# Patient Record
Sex: Female | Born: 1980
Health system: Southern US, Community
[De-identification: ages and names within clinical notes are randomized; demographics above are authoritative.]

---
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
## 1999-01-19 ENCOUNTER — Other Ambulatory Visit: Admission: RE | Admit: 1999-01-19 | Discharge: 1999-01-19 | Payer: Self-pay | Admitting: Obstetrics & Gynecology

## 1999-08-15 ENCOUNTER — Inpatient Hospital Stay (HOSPITAL_COMMUNITY): Admission: AD | Admit: 1999-08-15 | Discharge: 1999-08-18 | Payer: Self-pay | Admitting: Obstetrics and Gynecology

## 1999-09-25 ENCOUNTER — Other Ambulatory Visit: Admission: RE | Admit: 1999-09-25 | Discharge: 1999-09-25 | Payer: Self-pay | Admitting: Obstetrics & Gynecology

## 2000-09-24 ENCOUNTER — Other Ambulatory Visit: Admission: RE | Admit: 2000-09-24 | Discharge: 2000-09-24 | Payer: Self-pay | Admitting: Obstetrics and Gynecology

## 2000-11-20 ENCOUNTER — Inpatient Hospital Stay (HOSPITAL_COMMUNITY): Admission: AD | Admit: 2000-11-20 | Discharge: 2000-11-20 | Payer: Self-pay | Admitting: Obstetrics and Gynecology

## 2000-12-31 ENCOUNTER — Ambulatory Visit (HOSPITAL_COMMUNITY): Admission: RE | Admit: 2000-12-31 | Discharge: 2000-12-31 | Payer: Self-pay | Admitting: *Deleted

## 2001-04-29 ENCOUNTER — Inpatient Hospital Stay (HOSPITAL_COMMUNITY): Admission: AD | Admit: 2001-04-29 | Discharge: 2001-04-29 | Payer: Self-pay | Admitting: Obstetrics and Gynecology

## 2001-05-02 ENCOUNTER — Encounter (HOSPITAL_COMMUNITY): Admission: AD | Admit: 2001-05-02 | Discharge: 2001-05-08 | Payer: Self-pay | Admitting: Obstetrics & Gynecology

## 2001-05-07 ENCOUNTER — Encounter (INDEPENDENT_AMBULATORY_CARE_PROVIDER_SITE_OTHER): Payer: Self-pay | Admitting: Specialist

## 2001-05-07 ENCOUNTER — Inpatient Hospital Stay (HOSPITAL_COMMUNITY): Admission: AD | Admit: 2001-05-07 | Discharge: 2001-05-11 | Payer: Self-pay | Admitting: *Deleted

## 2004-05-22 ENCOUNTER — Emergency Department (HOSPITAL_COMMUNITY): Admission: EM | Admit: 2004-05-22 | Discharge: 2004-05-22 | Payer: Self-pay | Admitting: Emergency Medicine

## 2007-04-16 ENCOUNTER — Emergency Department (HOSPITAL_COMMUNITY): Admission: EM | Admit: 2007-04-16 | Discharge: 2007-04-16 | Payer: Self-pay | Admitting: Emergency Medicine

## 2007-05-28 ENCOUNTER — Encounter: Admission: RE | Admit: 2007-05-28 | Discharge: 2007-05-28 | Payer: Self-pay | Admitting: Internal Medicine

## 2007-07-09 ENCOUNTER — Encounter: Admission: RE | Admit: 2007-07-09 | Discharge: 2007-07-09 | Payer: Self-pay | Admitting: Internal Medicine

## 2009-01-14 ENCOUNTER — Encounter: Admission: RE | Admit: 2009-01-14 | Discharge: 2009-01-14 | Payer: Self-pay | Admitting: Internal Medicine

## 2010-08-18 NOTE — Op Note (Signed)
[facility]  Patient:    Mary Proctor, Mary Proctor Visit Number: [id number] MRN: [id number]          Service Type: OBS Location: 910A [id number] [id number] Attending Physician:  Michaelle Copas Dictated by:   Shearon Balo, M.D. Proc. Date: 05/10/01 Admit Date:  05/07/2001 Discharge Date: 05/11/2001   CC:         Conni Elliot, M.D.   Operative Report  PREOPERATIVE DIAGNOSIS:       A 30 year old, G2, P2, status post spontaneous vaginal delivery with undesired fertility.  POSTOPERATIVE DIAGNOSIS:      A 30 year old, G2, P2, status post spontaneous vaginal delivery with undesired fertility.  PROCEDURE:                    Postpartum bilateral tubal ligation.  SURGEON:                      Shearon Balo, M.D.  ASSISTANT:                    Conni Elliot, M.D.  ANESTHESIA:                   Epidural.  COMPLICATIONS:                None.  ESTIMATED BLOOD LOSS:         Minimum.  PATHOLOGY:                    Bilateral segments of fallopian tubes.  DISPOSITION:                  Recovery room, stable.  DESCRIPTION OF PROCEDURE:     The patient was counseled by Dr. Gavin Potters regarding tubal ligation. The patient was taken to the operating room where epidural anesthesia was bolused. She was prepped and draped in sterile fashion. An incision was made at the umbilicus with the scalpel and carried down to the underlying fascia with Mayo scissors. The fascia was entered sharply with Mayos as well as the peritoneum. The [employer] retractors were used to expose the patients right fallopian tube, which was identified and followed out to the fimbriated end. It was grasped with Babcock clamp in the mid section approximately 3 to 4 cm from the cornual region. Two sutures of 2-0 chromic were used to ligate the patients right fallopian tube and form a knuckle of tube. The section of tube was then excised and handed off for pathology. The tube was hemostatic and returned to  the abdominal cavity. The patients other fallopian tube was identified in a similar fashion. Again, doubly ligated and approximately 1-cm segment was excised. This was sent, as well, to pathology. This tube was noted to be hemostatic and returned to the abdominal cavity. The fascia was then closed with a running stitch of 0 Vicryl. The skin was then reapproximated with a subcuticular stitch of 4-0 PDS. The patient tolerated the procedure well and returned to recovery room in stable condition. Dictated by:   Shearon Balo, M.D. Attending Physician:  Michaelle Copas DD:  05/10/01 TD:  05/11/01 Job: [id number]

## 2010-12-21 LAB — RPR: RPR Ser Ql: NONREACTIVE

## 2010-12-21 LAB — HIV ANTIBODY (ROUTINE TESTING W REFLEX): HIV: NONREACTIVE

## 2010-12-21 LAB — POCT PREGNANCY, URINE
Operator id: 247071
Preg Test, Ur: NEGATIVE

## 2015-09-01 DIAGNOSIS — Z23 Encounter for immunization: Secondary | ICD-10-CM | POA: Diagnosis not present

## 2015-09-01 DIAGNOSIS — F909 Attention-deficit hyperactivity disorder, unspecified type: Secondary | ICD-10-CM | POA: Diagnosis not present

## 2015-10-28 DIAGNOSIS — Z0000 Encounter for general adult medical examination without abnormal findings: Secondary | ICD-10-CM | POA: Diagnosis not present

## 2015-12-12 DIAGNOSIS — S29019A Strain of muscle and tendon of unspecified wall of thorax, initial encounter: Secondary | ICD-10-CM | POA: Diagnosis not present

## 2015-12-19 DIAGNOSIS — S199XXA Unspecified injury of neck, initial encounter: Secondary | ICD-10-CM | POA: Diagnosis not present

## 2015-12-19 DIAGNOSIS — M5412 Radiculopathy, cervical region: Secondary | ICD-10-CM | POA: Diagnosis not present

## 2015-12-21 ENCOUNTER — Other Ambulatory Visit: Payer: Self-pay | Admitting: Internal Medicine

## 2015-12-21 DIAGNOSIS — M5412 Radiculopathy, cervical region: Secondary | ICD-10-CM

## 2015-12-23 ENCOUNTER — Ambulatory Visit
Admission: RE | Admit: 2015-12-23 | Discharge: 2015-12-23 | Disposition: A | Discharge status: Home / Self Care | Payer: BLUE CROSS/BLUE SHIELD | Source: Ambulatory Visit | Attending: Internal Medicine | Admitting: Internal Medicine

## 2015-12-23 DIAGNOSIS — M5412 Radiculopathy, cervical region: Secondary | ICD-10-CM

## 2015-12-23 DIAGNOSIS — M542 Cervicalgia: Secondary | ICD-10-CM | POA: Diagnosis not present

## 2015-12-23 DIAGNOSIS — S199XXA Unspecified injury of neck, initial encounter: Secondary | ICD-10-CM | POA: Diagnosis not present

## 2015-12-27 DIAGNOSIS — M5412 Radiculopathy, cervical region: Secondary | ICD-10-CM | POA: Diagnosis not present

## 2015-12-27 DIAGNOSIS — M4722 Other spondylosis with radiculopathy, cervical region: Secondary | ICD-10-CM | POA: Diagnosis not present

## 2015-12-27 DIAGNOSIS — M542 Cervicalgia: Secondary | ICD-10-CM | POA: Diagnosis not present

## 2015-12-27 DIAGNOSIS — M5030 Other cervical disc degeneration, unspecified cervical region: Secondary | ICD-10-CM | POA: Diagnosis not present

## 2015-12-27 DIAGNOSIS — M5020 Other cervical disc displacement, unspecified cervical region: Secondary | ICD-10-CM | POA: Diagnosis not present

## 2015-12-30 DIAGNOSIS — Z01812 Encounter for preprocedural laboratory examination: Secondary | ICD-10-CM | POA: Diagnosis not present

## 2016-01-05 DIAGNOSIS — M47812 Spondylosis without myelopathy or radiculopathy, cervical region: Secondary | ICD-10-CM | POA: Diagnosis not present

## 2016-01-05 DIAGNOSIS — M5030 Other cervical disc degeneration, unspecified cervical region: Secondary | ICD-10-CM | POA: Diagnosis not present

## 2016-01-05 DIAGNOSIS — M50222 Other cervical disc displacement at C5-C6 level: Secondary | ICD-10-CM | POA: Diagnosis not present

## 2016-01-05 DIAGNOSIS — M5020 Other cervical disc displacement, unspecified cervical region: Secondary | ICD-10-CM | POA: Diagnosis not present

## 2016-02-03 DIAGNOSIS — M5020 Other cervical disc displacement, unspecified cervical region: Secondary | ICD-10-CM | POA: Diagnosis not present

## 2016-04-02 HISTORY — PX: NECK SURGERY: SHX720

## 2016-04-04 DIAGNOSIS — R030 Elevated blood-pressure reading, without diagnosis of hypertension: Secondary | ICD-10-CM | POA: Diagnosis not present

## 2016-04-04 DIAGNOSIS — Z9889 Other specified postprocedural states: Secondary | ICD-10-CM | POA: Diagnosis not present

## 2016-04-04 DIAGNOSIS — Z6825 Body mass index (BMI) 25.0-25.9, adult: Secondary | ICD-10-CM | POA: Diagnosis not present

## 2016-10-17 DIAGNOSIS — R5383 Other fatigue: Secondary | ICD-10-CM | POA: Diagnosis not present

## 2016-10-17 DIAGNOSIS — Z0000 Encounter for general adult medical examination without abnormal findings: Secondary | ICD-10-CM | POA: Diagnosis not present

## 2016-10-17 DIAGNOSIS — F909 Attention-deficit hyperactivity disorder, unspecified type: Secondary | ICD-10-CM | POA: Diagnosis not present

## 2016-10-22 DIAGNOSIS — F988 Other specified behavioral and emotional disorders with onset usually occurring in childhood and adolescence: Secondary | ICD-10-CM | POA: Diagnosis not present

## 2016-10-22 DIAGNOSIS — E039 Hypothyroidism, unspecified: Secondary | ICD-10-CM | POA: Diagnosis not present

## 2016-10-22 DIAGNOSIS — R635 Abnormal weight gain: Secondary | ICD-10-CM | POA: Diagnosis not present

## 2016-11-14 DIAGNOSIS — E039 Hypothyroidism, unspecified: Secondary | ICD-10-CM | POA: Diagnosis not present

## 2017-01-29 DIAGNOSIS — Z23 Encounter for immunization: Secondary | ICD-10-CM | POA: Diagnosis not present

## 2017-06-28 DIAGNOSIS — Z5181 Encounter for therapeutic drug level monitoring: Secondary | ICD-10-CM | POA: Diagnosis not present

## 2017-10-17 DIAGNOSIS — F419 Anxiety disorder, unspecified: Secondary | ICD-10-CM | POA: Diagnosis not present

## 2017-10-22 DIAGNOSIS — E039 Hypothyroidism, unspecified: Secondary | ICD-10-CM | POA: Diagnosis not present

## 2017-12-23 DIAGNOSIS — Z23 Encounter for immunization: Secondary | ICD-10-CM | POA: Diagnosis not present

## 2017-12-26 DIAGNOSIS — Z5181 Encounter for therapeutic drug level monitoring: Secondary | ICD-10-CM | POA: Diagnosis not present

## 2018-01-22 DIAGNOSIS — E039 Hypothyroidism, unspecified: Secondary | ICD-10-CM | POA: Diagnosis not present

## 2018-01-22 DIAGNOSIS — F909 Attention-deficit hyperactivity disorder, unspecified type: Secondary | ICD-10-CM | POA: Diagnosis not present

## 2018-01-22 DIAGNOSIS — Z0000 Encounter for general adult medical examination without abnormal findings: Secondary | ICD-10-CM | POA: Diagnosis not present

## 2018-01-22 DIAGNOSIS — E663 Overweight: Secondary | ICD-10-CM | POA: Diagnosis not present

## 2018-01-22 DIAGNOSIS — M5412 Radiculopathy, cervical region: Secondary | ICD-10-CM | POA: Diagnosis not present

## 2018-04-23 DIAGNOSIS — Z0000 Encounter for general adult medical examination without abnormal findings: Secondary | ICD-10-CM | POA: Diagnosis not present

## 2018-04-23 DIAGNOSIS — E039 Hypothyroidism, unspecified: Secondary | ICD-10-CM | POA: Diagnosis not present

## 2018-04-24 DIAGNOSIS — R635 Abnormal weight gain: Secondary | ICD-10-CM | POA: Diagnosis not present

## 2018-04-24 DIAGNOSIS — R5383 Other fatigue: Secondary | ICD-10-CM | POA: Diagnosis not present

## 2018-06-25 DIAGNOSIS — R6889 Other general symptoms and signs: Secondary | ICD-10-CM | POA: Diagnosis not present

## 2018-07-29 DIAGNOSIS — Z5181 Encounter for therapeutic drug level monitoring: Secondary | ICD-10-CM | POA: Diagnosis not present

## 2018-11-20 ENCOUNTER — Other Ambulatory Visit: Payer: Self-pay

## 2018-11-20 DIAGNOSIS — Z20822 Contact with and (suspected) exposure to covid-19: Secondary | ICD-10-CM

## 2018-11-21 LAB — NOVEL CORONAVIRUS, NAA: SARS-CoV-2, NAA: NOT DETECTED

## 2019-01-12 ENCOUNTER — Other Ambulatory Visit: Payer: Self-pay

## 2019-01-12 DIAGNOSIS — Z20822 Contact with and (suspected) exposure to covid-19: Secondary | ICD-10-CM

## 2019-01-13 LAB — NOVEL CORONAVIRUS, NAA: SARS-CoV-2, NAA: NOT DETECTED

## 2019-05-31 ENCOUNTER — Emergency Department (HOSPITAL_COMMUNITY)
Admission: EM | Admit: 2019-05-31 | Discharge: 2019-05-31 | Disposition: A | Discharge status: Home / Self Care | Payer: 59 | Attending: Emergency Medicine | Admitting: Emergency Medicine

## 2019-05-31 ENCOUNTER — Other Ambulatory Visit: Payer: Self-pay

## 2019-05-31 ENCOUNTER — Encounter (HOSPITAL_COMMUNITY): Payer: Self-pay | Admitting: Emergency Medicine

## 2019-05-31 ENCOUNTER — Emergency Department (HOSPITAL_COMMUNITY): Payer: 59

## 2019-05-31 DIAGNOSIS — R55 Syncope and collapse: Secondary | ICD-10-CM | POA: Diagnosis not present

## 2019-05-31 DIAGNOSIS — S01312A Laceration without foreign body of left ear, initial encounter: Secondary | ICD-10-CM | POA: Diagnosis present

## 2019-05-31 DIAGNOSIS — F17210 Nicotine dependence, cigarettes, uncomplicated: Secondary | ICD-10-CM | POA: Diagnosis not present

## 2019-05-31 DIAGNOSIS — S0990XA Unspecified injury of head, initial encounter: Secondary | ICD-10-CM | POA: Diagnosis not present

## 2019-05-31 DIAGNOSIS — Y93I9 Activity, other involving external motion: Secondary | ICD-10-CM | POA: Diagnosis not present

## 2019-05-31 DIAGNOSIS — Y999 Unspecified external cause status: Secondary | ICD-10-CM | POA: Insufficient documentation

## 2019-05-31 DIAGNOSIS — Y929 Unspecified place or not applicable: Secondary | ICD-10-CM | POA: Diagnosis not present

## 2019-05-31 LAB — POC URINE PREG, ED: Preg Test, Ur: NEGATIVE

## 2019-05-31 MED ORDER — LIDOCAINE HCL 2 % IJ SOLN
10.0000 mL | Freq: Once | INTRAMUSCULAR | Status: AC
  Administered 2019-05-31: 13:00:00 200 mg via INTRADERMAL
  Filled 2019-05-31: qty 20

## 2019-05-31 MED ORDER — CEPHALEXIN 500 MG PO CAPS: 500.0000 mg | ORAL_CAPSULE | Freq: Four times a day (QID) | ORAL | 0 refills | Status: AC

## 2019-05-31 MED ORDER — FLUCONAZOLE 150 MG PO TABS: 150.0000 mg | ORAL_TABLET | Freq: Every day | ORAL | 0 refills | Status: AC

## 2019-05-31 MED ORDER — BACITRACIN ZINC 500 UNIT/GM EX OINT: 1.0000 "application " | TOPICAL_OINTMENT | Freq: Two times a day (BID) | CUTANEOUS | 0 refills | Status: AC

## 2019-05-31 NOTE — Discharge Instructions (Addendum)
You were given a prescription for antibiotics. Please take the antibiotic prescription fully.    Please use the bacitracin twice daily on the wound.  Make sure to keep it clean and dry.  You may clean it with soap and water daily.  You were given information to follow-up with Dr. Kenney Houseman in regards to your wound.  Please call the office schedule an appointment in the next 7 to 10 days for reevaluation.  You may require revision of the wound to help reduce scarring.  You should have the sutures removed in the next 7 to 10 days.  Please return to the emergency room immediately if you experience any new or worsening symptoms or any symptoms that indicate worsening infection such as fevers, increased redness/swelling/pain, warmth, or drainage from the affected area.

## 2019-05-31 NOTE — ED Notes (Signed)
Pt transported to CT ?
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
## 2019-05-31 NOTE — ED Notes (Signed)
Pt verbalized understanding of discharge instructions. Follow up care, prescriptions and pain management reviewed. Pt had no further questions. 
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
## 2019-05-31 NOTE — ED Notes (Signed)
Please grant [phone] with an update on his wife.

## 2020-11-09 DIAGNOSIS — E0590 Thyrotoxicosis, unspecified without thyrotoxic crisis or storm: Secondary | ICD-10-CM | POA: Diagnosis not present

## 2020-11-09 DIAGNOSIS — E559 Vitamin D deficiency, unspecified: Secondary | ICD-10-CM | POA: Diagnosis not present

## 2020-11-09 DIAGNOSIS — Z79899 Other long term (current) drug therapy: Secondary | ICD-10-CM | POA: Diagnosis not present
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
## 2021-02-13 DIAGNOSIS — M79672 Pain in left foot: Secondary | ICD-10-CM | POA: Diagnosis not present

## 2021-02-13 DIAGNOSIS — S92352A Displaced fracture of fifth metatarsal bone, left foot, initial encounter for closed fracture: Secondary | ICD-10-CM | POA: Diagnosis not present

## 2021-02-13 DIAGNOSIS — S93402A Sprain of unspecified ligament of left ankle, initial encounter: Secondary | ICD-10-CM | POA: Diagnosis not present

## 2021-02-14 DIAGNOSIS — S92355A Nondisplaced fracture of fifth metatarsal bone, left foot, initial encounter for closed fracture: Secondary | ICD-10-CM | POA: Diagnosis not present

## 2021-03-10 DIAGNOSIS — S92355A Nondisplaced fracture of fifth metatarsal bone, left foot, initial encounter for closed fracture: Secondary | ICD-10-CM | POA: Diagnosis not present

## 2021-04-14 DIAGNOSIS — S92355A Nondisplaced fracture of fifth metatarsal bone, left foot, initial encounter for closed fracture: Secondary | ICD-10-CM | POA: Diagnosis not present

## 2021-04-14 DIAGNOSIS — M79672 Pain in left foot: Secondary | ICD-10-CM | POA: Diagnosis not present

## 2021-12-12 DIAGNOSIS — F909 Attention-deficit hyperactivity disorder, unspecified type: Secondary | ICD-10-CM | POA: Diagnosis not present

## 2021-12-12 DIAGNOSIS — F418 Other specified anxiety disorders: Secondary | ICD-10-CM | POA: Diagnosis not present

## 2022-04-12 DIAGNOSIS — Z13220 Encounter for screening for lipoid disorders: Secondary | ICD-10-CM | POA: Diagnosis not present

## 2022-04-12 DIAGNOSIS — E559 Vitamin D deficiency, unspecified: Secondary | ICD-10-CM | POA: Diagnosis not present

## 2022-04-12 DIAGNOSIS — Z0001 Encounter for general adult medical examination with abnormal findings: Secondary | ICD-10-CM | POA: Diagnosis not present

## 2022-04-12 DIAGNOSIS — E0590 Thyrotoxicosis, unspecified without thyrotoxic crisis or storm: Secondary | ICD-10-CM | POA: Diagnosis not present

## 2022-04-12 DIAGNOSIS — Z79899 Other long term (current) drug therapy: Secondary | ICD-10-CM | POA: Diagnosis not present

## 2022-04-20 DIAGNOSIS — Z0000 Encounter for general adult medical examination without abnormal findings: Secondary | ICD-10-CM | POA: Diagnosis not present

## 2022-04-20 DIAGNOSIS — E039 Hypothyroidism, unspecified: Secondary | ICD-10-CM | POA: Diagnosis not present

## 2022-04-20 DIAGNOSIS — E559 Vitamin D deficiency, unspecified: Secondary | ICD-10-CM | POA: Diagnosis not present

## 2022-04-20 DIAGNOSIS — F902 Attention-deficit hyperactivity disorder, combined type: Secondary | ICD-10-CM | POA: Diagnosis not present
